# Patient Record
Sex: Female | Born: 1980
Health system: Southern US, Community
[De-identification: ages and names within clinical notes are randomized; demographics above are authoritative.]

## PROBLEM LIST (undated history)

## (undated) DIAGNOSIS — F419 Anxiety disorder, unspecified: Secondary | ICD-10-CM

## (undated) DIAGNOSIS — F329 Major depressive disorder, single episode, unspecified: Secondary | ICD-10-CM

## (undated) DIAGNOSIS — F32A Depression, unspecified: Secondary | ICD-10-CM

## (undated) HISTORY — PX: CERVICAL CERCLAGE: SHX1329

## (undated) HISTORY — DX: Anxiety disorder, unspecified: F41.9

## (undated) HISTORY — DX: Depression, unspecified: F32.A

## (undated) HISTORY — DX: Major depressive disorder, single episode, unspecified: F32.9

---

## 2016-04-16 DIAGNOSIS — N921 Excessive and frequent menstruation with irregular cycle: Secondary | ICD-10-CM | POA: Diagnosis not present

## 2016-04-16 DIAGNOSIS — R5383 Other fatigue: Secondary | ICD-10-CM | POA: Diagnosis not present

## 2016-04-16 DIAGNOSIS — E669 Obesity, unspecified: Secondary | ICD-10-CM | POA: Diagnosis not present

## 2016-04-27 DIAGNOSIS — Z111 Encounter for screening for respiratory tuberculosis: Secondary | ICD-10-CM | POA: Diagnosis not present

## 2016-04-27 DIAGNOSIS — Z01411 Encounter for gynecological examination (general) (routine) with abnormal findings: Secondary | ICD-10-CM | POA: Diagnosis not present

## 2016-04-27 DIAGNOSIS — Z6836 Body mass index (BMI) 36.0-36.9, adult: Secondary | ICD-10-CM | POA: Diagnosis not present

## 2016-04-27 DIAGNOSIS — N9089 Other specified noninflammatory disorders of vulva and perineum: Secondary | ICD-10-CM | POA: Diagnosis not present

## 2016-04-27 LAB — HM PAP SMEAR: HM PAP: NEGATIVE

## 2016-05-07 DIAGNOSIS — J069 Acute upper respiratory infection, unspecified: Secondary | ICD-10-CM | POA: Diagnosis not present

## 2016-05-07 DIAGNOSIS — B9789 Other viral agents as the cause of diseases classified elsewhere: Secondary | ICD-10-CM | POA: Diagnosis not present

## 2016-05-07 DIAGNOSIS — J111 Influenza due to unidentified influenza virus with other respiratory manifestations: Secondary | ICD-10-CM | POA: Diagnosis not present

## 2016-05-16 DIAGNOSIS — J0110 Acute frontal sinusitis, unspecified: Secondary | ICD-10-CM | POA: Diagnosis not present

## 2016-07-05 DIAGNOSIS — J0110 Acute frontal sinusitis, unspecified: Secondary | ICD-10-CM | POA: Diagnosis not present

## 2016-07-13 DIAGNOSIS — J40 Bronchitis, not specified as acute or chronic: Secondary | ICD-10-CM | POA: Diagnosis not present

## 2016-07-16 DIAGNOSIS — J40 Bronchitis, not specified as acute or chronic: Secondary | ICD-10-CM | POA: Diagnosis not present

## 2016-07-23 DIAGNOSIS — J40 Bronchitis, not specified as acute or chronic: Secondary | ICD-10-CM | POA: Diagnosis not present

## 2016-10-05 DIAGNOSIS — J208 Acute bronchitis due to other specified organisms: Secondary | ICD-10-CM | POA: Diagnosis not present

## 2016-10-05 DIAGNOSIS — B9689 Other specified bacterial agents as the cause of diseases classified elsewhere: Secondary | ICD-10-CM | POA: Diagnosis not present

## 2016-10-16 DIAGNOSIS — N644 Mastodynia: Secondary | ICD-10-CM | POA: Diagnosis not present

## 2016-10-24 DIAGNOSIS — J40 Bronchitis, not specified as acute or chronic: Secondary | ICD-10-CM | POA: Diagnosis not present

## 2017-05-09 DIAGNOSIS — B373 Candidiasis of vulva and vagina: Secondary | ICD-10-CM | POA: Diagnosis not present

## 2017-05-09 DIAGNOSIS — N898 Other specified noninflammatory disorders of vagina: Secondary | ICD-10-CM | POA: Diagnosis not present

## 2017-05-09 DIAGNOSIS — Z01411 Encounter for gynecological examination (general) (routine) with abnormal findings: Secondary | ICD-10-CM | POA: Diagnosis not present

## 2017-05-09 DIAGNOSIS — N760 Acute vaginitis: Secondary | ICD-10-CM | POA: Diagnosis not present

## 2017-06-21 DIAGNOSIS — E669 Obesity, unspecified: Secondary | ICD-10-CM | POA: Diagnosis not present

## 2017-06-21 DIAGNOSIS — Z760 Encounter for issue of repeat prescription: Secondary | ICD-10-CM | POA: Diagnosis not present

## 2017-08-23 DIAGNOSIS — B351 Tinea unguium: Secondary | ICD-10-CM | POA: Diagnosis not present

## 2017-09-18 DIAGNOSIS — Z3169 Encounter for other general counseling and advice on procreation: Secondary | ICD-10-CM | POA: Diagnosis not present

## 2017-10-28 DIAGNOSIS — F411 Generalized anxiety disorder: Secondary | ICD-10-CM | POA: Diagnosis not present

## 2017-10-28 DIAGNOSIS — F3340 Major depressive disorder, recurrent, in remission, unspecified: Secondary | ICD-10-CM | POA: Diagnosis not present

## 2017-11-01 DIAGNOSIS — F411 Generalized anxiety disorder: Secondary | ICD-10-CM | POA: Diagnosis not present

## 2017-11-01 DIAGNOSIS — F410 Panic disorder [episodic paroxysmal anxiety] without agoraphobia: Secondary | ICD-10-CM | POA: Diagnosis not present

## 2017-11-01 DIAGNOSIS — F339 Major depressive disorder, recurrent, unspecified: Secondary | ICD-10-CM | POA: Diagnosis not present

## 2018-01-16 DIAGNOSIS — Z23 Encounter for immunization: Secondary | ICD-10-CM | POA: Diagnosis not present

## 2018-01-31 DIAGNOSIS — F410 Panic disorder [episodic paroxysmal anxiety] without agoraphobia: Secondary | ICD-10-CM | POA: Diagnosis not present

## 2018-01-31 DIAGNOSIS — F339 Major depressive disorder, recurrent, unspecified: Secondary | ICD-10-CM | POA: Diagnosis not present

## 2018-01-31 DIAGNOSIS — F411 Generalized anxiety disorder: Secondary | ICD-10-CM | POA: Diagnosis not present

## 2018-02-17 ENCOUNTER — Ambulatory Visit: Payer: Self-pay | Admitting: *Deleted

## 2018-02-17 NOTE — Telephone Encounter (Signed)
FYI. Patient scheduled for an appointment 02/18/18 to establish care

## 2018-02-17 NOTE — Telephone Encounter (Signed)
Pt called with complaints of pain in the left side of her chest that started over a month ago; it has not been intermittent; for the past couple of days it left arm and shoulder are sore and hurting; she also has a shooting pain on the left side of her left shoulder blade; recommendations made per nurse triage protocol; pt offered and accepted new pt appointment with Malva Coganody Martin, [facility], 02/18/18 at 1100;  explained to pt that she will have fasting labs after her visit; and to please contact her prior MD's office so that her records could be faxed to [phone]; she verbalized understanding; will route to office for notification of this upcoming appointment.  Reason for Disposition . [1] Chest pain(s) lasting a few seconds AND [2] persists > 3 days  Answer Assessment - Initial Assessment Questions 1. LOCATION: "Where does it hurt?"       Left side of chest 2. RADIATION: "Does the pain go anywhere else?" (e.g., into neck, jaw, arms, back)    Left shoulder and  arm 3. ONSET: "When did the chest pain begin?" (Minutes, hours or days)      Several weeks ago 4. PATTERN "Does the pain come and go, or has it been constant since it started?"  "Does it get worse with exertion?"      Started intermittent, but now constant  5. DURATION: "How long does it last" (e.g., seconds, minutes, hours)     hours 6. SEVERITY: "How bad is the pain?"  (e.g., Scale 1-10; mild, moderate, or severe)    - MILD (1-3): doesn't interfere with normal activities     - MODERATE (4-7): interferes with normal activities or awakens from sleep    - SEVERE (8-10): excruciating pain, unable to do any normal activities       moderate 7. CARDIAC RISK FACTORS: "Do you have any history of heart problems or risk factors for heart disease?" (e.g., prior heart attack, angina; high blood pressure, diabetes, being overweight, high cholesterol, smoking, or strong family history of heart disease)    Former smoker, overweight 8.  PULMONARY RISK FACTORS: "Do you have any history of lung disease?"  (e.g., blood clots in lung, asthma, emphysema, birth control pills)     no 9. CAUSE: "What do you think is causing the chest pain?"     Not sure 10. OTHER SYMPTOMS: "Do you have any other symptoms?" (e.g., dizziness, nausea, vomiting, sweating, fever, difficulty breathing, cough)       no 11. PREGNANCY: "Is there any chance you are pregnant?" "When was your last menstrual period?"       nexplanon in left arm; LMP 02/16/18  Protocols used: CHEST PAIN-A-AH

## 2018-02-18 ENCOUNTER — Other Ambulatory Visit: Payer: Self-pay

## 2018-02-18 ENCOUNTER — Encounter: Payer: Self-pay | Admitting: Physician Assistant

## 2018-02-18 ENCOUNTER — Ambulatory Visit: Payer: BLUE CROSS/BLUE SHIELD | Admitting: Physician Assistant

## 2018-02-18 VITALS — BP 120/84 | HR 87 | Temp 98.8°F | Resp 14 | Ht 64.0 in | Wt 206.0 lb

## 2018-02-18 DIAGNOSIS — R0789 Other chest pain: Secondary | ICD-10-CM

## 2018-02-18 LAB — CBC WITH DIFFERENTIAL/PLATELET
BASOS ABS: 0 10*3/uL (ref 0.0–0.1)
Basophils Relative: 0.3 % (ref 0.0–3.0)
Eosinophils Absolute: 0.2 10*3/uL (ref 0.0–0.7)
Eosinophils Relative: 2.1 % (ref 0.0–5.0)
HEMATOCRIT: 40.3 % (ref 36.0–46.0)
Hemoglobin: 13.7 g/dL (ref 12.0–15.0)
LYMPHS PCT: 36.4 % (ref 12.0–46.0)
Lymphs Abs: 2.8 10*3/uL (ref 0.7–4.0)
MCHC: 34 g/dL (ref 30.0–36.0)
MCV: 91.1 fl (ref 78.0–100.0)
MONOS PCT: 5.7 % (ref 3.0–12.0)
Monocytes Absolute: 0.4 10*3/uL (ref 0.1–1.0)
Neutro Abs: 4.2 10*3/uL (ref 1.4–7.7)
Neutrophils Relative %: 55.5 % (ref 43.0–77.0)
Platelets: 333 10*3/uL (ref 150.0–400.0)
RBC: 4.42 Mil/uL (ref 3.87–5.11)
RDW: 12.8 % (ref 11.5–15.5)
WBC: 7.6 10*3/uL (ref 4.0–10.5)

## 2018-02-18 LAB — COMPREHENSIVE METABOLIC PANEL
ALT: 16 U/L (ref 0–35)
AST: 16 U/L (ref 0–37)
Albumin: 4.4 g/dL (ref 3.5–5.2)
Alkaline Phosphatase: 60 U/L (ref 39–117)
BILIRUBIN TOTAL: 0.4 mg/dL (ref 0.2–1.2)
BUN: 10 mg/dL (ref 6–23)
CO2: 26 meq/L (ref 19–32)
CREATININE: 0.6 mg/dL (ref 0.40–1.20)
Calcium: 9.3 mg/dL (ref 8.4–10.5)
Chloride: 106 mEq/L (ref 96–112)
GFR: 118.93 mL/min (ref >60.00)
Glucose, Bld: 90 mg/dL (ref 70–99)
Potassium: 4 mEq/L (ref 3.5–5.1)
SODIUM: 139 meq/L (ref 135–145)
Total Protein: 7.1 g/dL (ref 6.0–8.3)

## 2018-02-18 LAB — LIPASE: Lipase: 21 U/L (ref 11.0–59.0)

## 2018-02-18 MED ORDER — CYCLOBENZAPRINE HCL 5 MG PO TABS: 5.0000 mg | ORAL_TABLET | Freq: Every day | ORAL | 1 refills | Status: DC

## 2018-02-18 NOTE — Patient Instructions (Signed)
Please go to the lab today for blood work.  I will call you with your results. We will alter treatment regimen(s) if indicated by your results.   [facility] afternoon or tomorrow morning for x-ray. I will call with results and we will discuss next steps based on imaging and lab findings.  For now start a daily Aleve.  I am giving you a low dose Flexeril to take in the evening to help with muscle tension in neck and chest.

## 2018-02-18 NOTE — Progress Notes (Signed)
New Patient Office Visit  Subjective:  Patient ID: Becky Fernandez, female    DOB: 09-27-1980  Age: 37 y.o. MRN: [id number]  CC:  Chief Complaint  Patient presents with  . Establish Care    HPI Becky Fernandez presents to clinic today as a new patient with acute concerns. Patent with prior history of anxiety and depression, followed by the [facility] in [place]. Is currently on a regimen of Wellbutrin XL 150 mg daily. Is also taking Ativan '1mg'$  PRN for more acute anxiety. Has follow-up scheduled monthly at present. Notes she is tolerating medications well and that they are helping with her mood.   Patient endorses 6 months of intermittent upper left chest pain with radiation into her shoulder blade. Notes that episodes can last for several hours at a time and are not associated with any certain activity. Notes chronic tension in her neck related to stress and anxiety but denies any other known issued with neck or spine. Patient notes that when pain is present it can rate up to an 8/10. Denies any pleuritic pain or pain with ROM of chest or shoulder. Denies any palpitations, lightheadedness or dizziness. Denies any exertional component of symptoms. Denies heart burn, indigestion or epigastric pain. Denies change to bowel/bladderf habits. Initially thought symptoms were related to stress/anxiety but notes mood has improved. Denies known history of early CAD. Endorses good sleep and a well-balanced diet. Keeps active at the gym and chasing after her children.   Past Medical History:  Diagnosis Date  . Anxiety   . Depression     History reviewed. No pertinent surgical history.  Family History  Problem Relation Age of Onset  . Thyroid disease Mother   . Hyperlipidemia Father   . Intellectual disability Daughter   . Learning disabilities Daughter   . Premature birth Daughter     Social History   Socioeconomic History  . Marital status: Married    Spouse name: Not on file  . Number of  children: Not on file  . Years of education: Not on file  . Highest education level: Not on file  Occupational History  . Not on file  Social Needs  . Financial resource strain: Not on file  . Food insecurity:    Worry: Not on file    Inability: Not on file  . Transportation needs:    Medical: Not on file    Non-medical: Not on file  Tobacco Use  . Smoking status: Never Smoker  . Smokeless tobacco: Never Used  Substance and Sexual Activity  . Alcohol use: Yes    Comment: occasional  . Drug use: Yes    Types: Marijuana  . Sexual activity: Not on file  Lifestyle  . Physical activity:    Days per week: Not on file    Minutes per session: Not on file  . Stress: Not on file  Relationships  . Social connections:    Talks on phone: Not on file    Gets together: Not on file    Attends religious service: Not on file    Active member of club or organization: Not on file    Attends meetings of clubs or organizations: Not on file    Relationship status: Not on file  . Intimate partner violence:    Fear of current or ex partner: Not on file    Emotionally abused: Not on file    Physically abused: Not on file    Forced sexual activity: Not on  file  Other Topics Concern  . Not on file  Social History Narrative  . Not on file   ROS Review of Systems  Constitutional: Negative for activity change, appetite change and fatigue.  Respiratory: Positive for chest tightness. Negative for apnea, cough, choking, shortness of breath, wheezing and stridor.   Cardiovascular: Positive for chest pain. Negative for palpitations and leg swelling.  Gastrointestinal: Negative for abdominal distention, abdominal pain, blood in stool, constipation, diarrhea, nausea and vomiting.  Genitourinary: Negative.   Neurological: Negative for dizziness, syncope, light-headedness and headaches.  Psychiatric/Behavioral: The patient is nervous/anxious (chronic and improved per patient.).     Objective:    Today's Vitals: BP 120/84   Pulse 87   Temp 98.8 F (37.1 C) (Oral)   Resp 14   Ht '5\' 4"'$  (1.626 m)   Wt 206 lb (93.4 kg)   SpO2 99%   BMI 35.36 kg/m   Physical Exam  Constitutional: She is oriented to person, place, and time. She appears well-developed and well-nourished. No distress.  HENT:  Head: Normocephalic and atraumatic.  Right Ear: External ear normal.  Left Ear: External ear normal.  Nose: Nose normal.  Mouth/Throat: Oropharynx is clear and moist.  TM within normal limits bilaterally.   Eyes: Pupils are equal, round, and reactive to light. Conjunctivae and EOM are normal.  Neck: Neck supple. Muscular tenderness present. No spinous process tenderness present. Normal range of motion present. No thyromegaly present.  Cardiovascular: Normal rate, regular rhythm, normal heart sounds and intact distal pulses.  Pulmonary/Chest: Effort normal and breath sounds normal. No respiratory distress. She has no wheezes. She has no rales. She exhibits no tenderness.  Abdominal: Soft. Bowel sounds are normal. She exhibits no distension and no mass. There is no tenderness. No hernia.  Musculoskeletal:       Cervical back: She exhibits normal range of motion, no tenderness, no bony tenderness and no swelling.       Thoracic back: Normal.  Lymphadenopathy:    She has no cervical adenopathy.  Neurological: She is alert and oriented to person, place, and time.  Skin: She is not diaphoretic.  Psychiatric: She has a normal mood and affect. Her behavior is normal.  Vitals reviewed.  Assessment & Plan:   Problem List Items Addressed This Visit      Other   Atypical chest pain - Primary    First time discussed with this provider. No prior assessment. Does not seem to be cardiac or gastric in nature. Vitals stable today. EKG obtained revealing NSR which is reassuring. Giving her smoking history will check CXR and labs today. If negative would recommend further assessment of her neck as a  potential cause as I am concerned for pinching of cervical nerves. There is muscular tension in the cervical neck and upper chest. Will start small-dose of a muscle relaxer to help with this. Supportive measures reviewed. Strict ER precautions reviewed. Close follow-up to be scheduled.       Relevant Orders   EKG 12-Lead (Completed)   DG Chest 2 View   CBC w/Diff (Completed)   Lipase (Completed)   Comp Met (CMET) (Completed)      Outpatient Encounter Medications as of 02/18/2018  Medication Sig  . buPROPion (WELLBUTRIN XL) 150 MG 24 hr tablet TK 1 T PO QAM  . LORazepam (ATIVAN) 1 MG tablet TK 1 T PO QAM PRN FOR SEVERE ANXIETY  . terbinafine (LAMISIL) 250 MG tablet Take 250 mg by mouth daily.  Marland Kitchen  cyclobenzaprine (FLEXERIL) 5 MG tablet Take 1 tablet (5 mg total) by mouth at bedtime.   No facility-administered encounter medications on file as of 02/18/2018.     Follow-up: No follow-ups on file.   Leeanne Rio, PA-C

## 2018-02-19 DIAGNOSIS — R0789 Other chest pain: Secondary | ICD-10-CM | POA: Insufficient documentation

## 2018-02-19 NOTE — Assessment & Plan Note (Signed)
First time discussed with this provider. No prior assessment. Does not seem to be cardiac or gastric in nature. Vitals stable today. EKG obtained revealing NSR which is reassuring. Giving her smoking history will check CXR and labs today. If negative would recommend further assessment of her neck as a potential cause as I am concerned for pinching of cervical nerves. There is muscular tension in the cervical neck and upper chest. Will start small-dose of a muscle relaxer to help with this. Supportive measures reviewed. Strict ER precautions reviewed. Close follow-up to be scheduled.

## 2018-02-24 ENCOUNTER — Other Ambulatory Visit: Payer: Self-pay | Admitting: Physician Assistant

## 2018-02-24 ENCOUNTER — Ambulatory Visit (INDEPENDENT_AMBULATORY_CARE_PROVIDER_SITE_OTHER): Payer: BLUE CROSS/BLUE SHIELD

## 2018-02-24 DIAGNOSIS — F410 Panic disorder [episodic paroxysmal anxiety] without agoraphobia: Secondary | ICD-10-CM | POA: Diagnosis not present

## 2018-02-24 DIAGNOSIS — R0789 Other chest pain: Secondary | ICD-10-CM

## 2018-02-24 DIAGNOSIS — J42 Unspecified chronic bronchitis: Secondary | ICD-10-CM

## 2018-02-24 DIAGNOSIS — R079 Chest pain, unspecified: Secondary | ICD-10-CM | POA: Diagnosis not present

## 2018-02-24 DIAGNOSIS — Z3009 Encounter for other general counseling and advice on contraception: Secondary | ICD-10-CM

## 2018-02-24 DIAGNOSIS — F339 Major depressive disorder, recurrent, unspecified: Secondary | ICD-10-CM | POA: Diagnosis not present

## 2018-02-24 DIAGNOSIS — F411 Generalized anxiety disorder: Secondary | ICD-10-CM | POA: Diagnosis not present

## 2018-02-24 MED ORDER — NORETHIN ACE-ETH ESTRAD-FE 1-20 MG-MCG PO TABS: 1.0000 | ORAL_TABLET | Freq: Every day | ORAL | 11 refills | Status: AC

## 2018-03-10 ENCOUNTER — Ambulatory Visit: Payer: BLUE CROSS/BLUE SHIELD | Admitting: Family Medicine

## 2018-03-11 DIAGNOSIS — J069 Acute upper respiratory infection, unspecified: Secondary | ICD-10-CM | POA: Diagnosis not present

## 2018-04-18 ENCOUNTER — Encounter: Payer: Self-pay | Admitting: Pulmonary Disease

## 2018-04-18 ENCOUNTER — Ambulatory Visit: Payer: BLUE CROSS/BLUE SHIELD | Admitting: Pulmonary Disease

## 2018-04-18 VITALS — BP 120/80 | HR 84 | Ht 63.0 in | Wt 212.2 lb

## 2018-04-18 DIAGNOSIS — R05 Cough: Secondary | ICD-10-CM

## 2018-04-18 DIAGNOSIS — F1210 Cannabis abuse, uncomplicated: Secondary | ICD-10-CM

## 2018-04-18 DIAGNOSIS — R059 Cough, unspecified: Secondary | ICD-10-CM

## 2018-04-18 NOTE — Patient Instructions (Signed)
Thank you for visiting Dr. Tonia Brooms at [facility]. Today we recommend the following: Orders Placed This Encounter  Procedures  . Pulmonary function test    Return in about 2 weeks (around 05/02/2018).Please schedule F/U with NP for PFT.  results.

## 2018-04-18 NOTE — Progress Notes (Signed)
Synopsis: Referred in Jan 2020 for SOB, Chest tightness by Waldon Merl, PA-C  Subjective:   PATIENT ID: Becky Fernandez GENDER: female DOB: [birth year], MRN: [id number]  Chief Complaint  Patient presents with  . Consult    Consult for Bronchitis. Hx of bronchitis. She states she started having chest pains that radiated into back. States she has chest tightness intermittently and she still has the upper back pain. She has not smoked in 2 weeks.      PMH anxiety and depress, current MJ smoker, quit smoking a few weeks ago, She has pain in her upper left posterior back. On occasion is having a dull ache in the back. She cant reproduce it if she moves. She was seen by her PCP and had a CXR ordered. She has smoked MJ on and off for the past 15 years, usually 3-5 times per week. Currently seeing a psychiatrist and taking wellbutrin to manage her anxiety and depression. She has been able to quit. Prior to this event she has had bronchitis. She works for [employer] for the past 15 years. 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    Past Medical History:  Diagnosis Date  . Anxiety   . Depression      Family History  Problem Relation Age of Onset  . Thyroid disease Mother   . Hyperlipidemia Father   . Intellectual disability Daughter   . Learning disabilities Daughter   . Premature birth Daughter      Past Surgical History:  Procedure Laterality Date  . CERVICAL CERCLAGE      Social History   Socioeconomic History  . Marital status: Married    Spouse name: Not on file  . Number of children: Not on file  . Years of education: Not on file  . Highest education level: Not on file  Occupational History  . Not on file  Social Needs  . Financial resource strain: Not on file  . Food insecurity:    Worry: Not on file    Inability: Not on file  . Transportation needs:    Medical: Not on file    Non-medical: Not on file  Tobacco Use  . Smoking status: Former Games developer   . Smokeless tobacco: Never Used  Substance and Sexual Activity  . Alcohol use: Yes    Comment: occasional  . Drug use: Yes    Frequency: 5.0 times per week    Types: Marijuana    Comment: 15 years  . Sexual activity: Not on file  Lifestyle  . Physical activity:    Days per week: Not on file    Minutes per session: Not on file  . Stress: Not on file  Relationships  . Social connections:    Talks on phone: Not on file    Gets together: Not on file    Attends religious service: Not on file    Active member of club or organization: Not on file    Attends meetings of clubs or organizations: Not on file    Relationship status: Not on file  . Intimate partner violence:    Fear of current or ex partner: Not on file    Emotionally abused: Not on file    Physically abused: Not on file    Forced sexual activity: Not on file  Other Topics Concern  . Not on file  Social History Narrative  . Not on file     No Known Allergies   Outpatient Medications Prior  to Visit  Medication Sig Dispense Refill  . buPROPion (WELLBUTRIN XL) 300 MG 24 hr tablet Take 300 mg by mouth daily.    Marland Kitchen. ibuprofen (ADVIL,MOTRIN) 200 MG tablet Take 200 mg by mouth every 6 (six) hours as needed.    Marland Kitchen. LORazepam (ATIVAN) 1 MG tablet TK 1 T PO QAM PRN FOR SEVERE ANXIETY  1  . norethindrone-ethinyl estradiol (LOESTRIN FE 1/20) 1-20 MG-MCG tablet Take 1 tablet by mouth daily. 1 Package 11  . buPROPion (WELLBUTRIN XL) 150 MG 24 hr tablet TK 1 T PO QAM  1  . cyclobenzaprine (FLEXERIL) 5 MG tablet Take 1 tablet (5 mg total) by mouth at bedtime. (Patient not taking: Reported on 04/18/2018) 30 tablet 1  . terbinafine (LAMISIL) 250 MG tablet Take 250 mg by mouth daily.     No facility-administered medications prior to visit.     Review of Systems  Constitutional: Negative for chills, fever, malaise/fatigue and weight loss.  HENT: Negative for hearing loss, sore throat and tinnitus.   Eyes: Negative for blurred vision  and double vision.  Respiratory: Negative for cough, hemoptysis, sputum production, shortness of breath, wheezing and stridor.   Cardiovascular: Positive for chest pain. Negative for palpitations, orthopnea, leg swelling and PND.  Gastrointestinal: Negative for abdominal pain, constipation, diarrhea, heartburn, nausea and vomiting.  Genitourinary: Negative for dysuria, hematuria and urgency.  Musculoskeletal: Negative for joint pain and myalgias.  Skin: Negative for itching and rash.  Neurological: Negative for dizziness, tingling, weakness and headaches.  Endo/Heme/Allergies: Negative for environmental allergies. Does not bruise/bleed easily.  Psychiatric/Behavioral: Negative for depression. The patient is not nervous/anxious and does not have insomnia.   All other systems reviewed and are negative.    Objective:  Physical Exam Vitals signs reviewed.  Constitutional:      General: She is not in acute distress.    Appearance: She is well-developed.  HENT:     Head: Normocephalic and atraumatic.  Eyes:     General: No scleral icterus.    Conjunctiva/sclera: Conjunctivae normal.     Pupils: Pupils are equal, round, and reactive to light.  Neck:     Musculoskeletal: Neck supple.     Vascular: No JVD.     Trachea: No tracheal deviation.  Cardiovascular:     Rate and Rhythm: Normal rate and regular rhythm.     Heart sounds: Normal heart sounds. No murmur.  Pulmonary:     Effort: Pulmonary effort is normal. No tachypnea, accessory muscle usage or respiratory distress.     Breath sounds: Normal breath sounds. No stridor. No wheezing, rhonchi or rales.  Abdominal:     General: Bowel sounds are normal. There is no distension.     Palpations: Abdomen is soft.     Tenderness: There is no abdominal tenderness.  Musculoskeletal:        General: No tenderness.  Lymphadenopathy:     Cervical: No cervical adenopathy.  Skin:    General: Skin is warm and dry.     Capillary Refill:  Capillary refill takes less than 2 seconds.     Findings: No rash.  Neurological:     Mental Status: She is alert and oriented to person, place, and time.  Psychiatric:        Behavior: Behavior normal.      Vitals:   04/18/18 0931  BP: 120/80  Pulse: 84  SpO2: 100%  Weight: 212 lb 3.2 oz (96.3 kg)  Height: 5\' 3"  (1.6 m)   100%  on RA BMI Readings from Last 3 Encounters:  04/18/18 37.59 kg/m  02/18/18 35.36 kg/m   Wt Readings from Last 3 Encounters:  04/18/18 212 lb 3.2 oz (96.3 kg)  02/18/18 206 lb (93.4 kg)     CBC    Component Value Date/Time   WBC 7.6 02/18/2018 1159   RBC 4.42 02/18/2018 1159   HGB 13.7 02/18/2018 1159   HCT 40.3 02/18/2018 1159   PLT 333.0 02/18/2018 1159   MCV 91.1 02/18/2018 1159   MCHC 34.0 02/18/2018 1159   RDW 12.8 02/18/2018 1159   LYMPHSABS 2.8 02/18/2018 1159   MONOABS 0.4 02/18/2018 1159   EOSABS 0.2 02/18/2018 1159   BASOSABS 0.0 02/18/2018 1159   Chest Imaging: 04/26/2017 chest x-ray: Evidence of bronchitis-like changes The patient's images have been independently reviewed by me.    Pulmonary Functions Testing Results: None   FeNO: None   Pathology: None   Echocardiogram: None   Heart Catheterization: None     Assessment & Plan:   Marijuana abuse - Plan: Pulmonary function test  Cough - Plan: Pulmonary function test  Discussion:  This is a 38 year old female no significant medical problems.  Does use marijuana for the past 15 years 3-5 times a week.  She recently had an episode of upper posterior back vague chest discomfort.  This is better and has slowly improved over the past several weeks.  She was seen by her primary care provider which ordered a chest x-ray that revealed some evidence of bronchitis.  She was referred here for evaluation.  She has no obvious or other clinical signs of interstitial lung disease.  The radiologist did comment on some interstitial thickening seen on chest x-ray.  I reviewed the  images today with the patient in the office.  We will start evaluation by obtaining pulmonary function tests.  I believe that for pulmonary function tests are normal I do not see the need for axial CT imaging of the chest.  How if there is any abnormalities in the pulmonary function test I would suggest that we would proceed with imaging of the chest. Today we discussed that today in the office regarding benefits and risk and alternatives of proceeding with imaging versus holding off on this at this time.  She would like to hold off and does agree.  Patient to return to clinic in 1 to 2 weeks to have pulmonary function test completed and review this with 1 of our nurse practitioners. She was also counseled on continued cessation of marijuana use.  She has not done this in the past 2 weeks and she plans to completely quit.  Greater than 50% of this patient's 40-minute office was spent face-to-face discussing but recommendations and treatment plan.   Current Outpatient Medications:  .  buPROPion (WELLBUTRIN XL) 300 MG 24 hr tablet, Take 300 mg by mouth daily., Disp: , Rfl:  .  ibuprofen (ADVIL,MOTRIN) 200 MG tablet, Take 200 mg by mouth every 6 (six) hours as needed., Disp: , Rfl:  .  LORazepam (ATIVAN) 1 MG tablet, TK 1 T PO QAM PRN FOR SEVERE ANXIETY, Disp: , Rfl: 1 .  norethindrone-ethinyl estradiol (LOESTRIN FE 1/20) 1-20 MG-MCG tablet, Take 1 tablet by mouth daily., Disp: 1 Package, Rfl: 11   Josephine IgoBradley L Ilea Hilton, DO [facility] 04/18/2018 11:27 AM

## 2018-05-09 ENCOUNTER — Ambulatory Visit: Payer: BLUE CROSS/BLUE SHIELD | Admitting: Primary Care

## 2018-05-13 DIAGNOSIS — F410 Panic disorder [episodic paroxysmal anxiety] without agoraphobia: Secondary | ICD-10-CM | POA: Diagnosis not present

## 2018-05-13 DIAGNOSIS — F411 Generalized anxiety disorder: Secondary | ICD-10-CM | POA: Diagnosis not present

## 2018-05-13 DIAGNOSIS — F339 Major depressive disorder, recurrent, unspecified: Secondary | ICD-10-CM | POA: Diagnosis not present

## 2018-05-26 NOTE — Progress Notes (Deleted)
 @  Patient ID: Becky Fernandez, female    DOB: 1980-05-30, 38 y.o.   MRN: [id number]  No chief complaint on file.   Referring provider: Noel Journey  HPI: 38 year old female, former smoker (MJ smoker x 15 years.) PMH significant for bronchitis, anxiety, depression. Patient of Dr. Tonia Brooms, seen for initial consult on 04/18/18 for chest tightness and back pain. Planning for PFTs, if normal no need for CT imaging of the chest. If there is any abnormalities in PFTs needs imaging. Return in 1-2 weeks.      No Known Allergies  Immunization History  Administered Date(s) Administered  . Influenza-Unspecified 12/31/2017    Past Medical History:  Diagnosis Date  . Anxiety   . Depression     Tobacco History: Social History   Tobacco Use  Smoking Status Former Smoker  Smokeless Tobacco Never Used   Counseling given: Not Answered   Outpatient Medications Prior to Visit  Medication Sig Dispense Refill  . buPROPion (WELLBUTRIN XL) 300 MG 24 hr tablet Take 300 mg by mouth daily.    Marland Kitchen ibuprofen (ADVIL,MOTRIN) 200 MG tablet Take 200 mg by mouth every 6 (six) hours as needed.    Marland Kitchen LORazepam (ATIVAN) 1 MG tablet TK 1 T PO QAM PRN FOR SEVERE ANXIETY  1  . norethindrone-ethinyl estradiol (LOESTRIN FE 1/20) 1-20 MG-MCG tablet Take 1 tablet by mouth daily. 1 Package 11   No facility-administered medications prior to visit.       Review of Systems  Review of Systems   Physical Exam  There were no vitals taken for this visit. Physical Exam   Lab Results:  CBC    Component Value Date/Time   WBC 7.6 02/18/2018 1159   RBC 4.42 02/18/2018 1159   HGB 13.7 02/18/2018 1159   HCT 40.3 02/18/2018 1159   PLT 333.0 02/18/2018 1159   MCV 91.1 02/18/2018 1159   MCHC 34.0 02/18/2018 1159   RDW 12.8 02/18/2018 1159   LYMPHSABS 2.8 02/18/2018 1159   MONOABS 0.4 02/18/2018 1159   EOSABS 0.2 02/18/2018 1159   BASOSABS 0.0 02/18/2018 1159    BMET    Component Value  Date/Time   NA 139 02/18/2018 1159   K 4.0 02/18/2018 1159   CL 106 02/18/2018 1159   CO2 26 02/18/2018 1159   GLUCOSE 90 02/18/2018 1159   BUN 10 02/18/2018 1159   CREATININE 0.60 02/18/2018 1159   CALCIUM 9.3 02/18/2018 1159    BNP No results found for: BNP  ProBNP No results found for: PROBNP  Imaging: No results found.   Assessment & Plan:   No problem-specific Assessment & Plan notes found for this encounter.     Glenford Bayley, NP 05/26/2018

## 2018-05-27 ENCOUNTER — Ambulatory Visit: Payer: BLUE CROSS/BLUE SHIELD | Admitting: Primary Care

## 2018-06-24 DIAGNOSIS — F339 Major depressive disorder, recurrent, unspecified: Secondary | ICD-10-CM | POA: Diagnosis not present

## 2018-06-24 DIAGNOSIS — F411 Generalized anxiety disorder: Secondary | ICD-10-CM | POA: Diagnosis not present

## 2018-06-24 DIAGNOSIS — F410 Panic disorder [episodic paroxysmal anxiety] without agoraphobia: Secondary | ICD-10-CM | POA: Diagnosis not present

## 2018-06-26 ENCOUNTER — Encounter: Payer: Self-pay | Admitting: Emergency Medicine

## 2018-07-18 DIAGNOSIS — Z3046 Encounter for surveillance of implantable subdermal contraceptive: Secondary | ICD-10-CM | POA: Diagnosis not present

## 2018-07-18 DIAGNOSIS — Z309 Encounter for contraceptive management, unspecified: Secondary | ICD-10-CM | POA: Diagnosis not present

## 2018-07-18 DIAGNOSIS — Z3200 Encounter for pregnancy test, result unknown: Secondary | ICD-10-CM | POA: Diagnosis not present

## 2018-08-14 DIAGNOSIS — M549 Dorsalgia, unspecified: Secondary | ICD-10-CM | POA: Diagnosis not present

## 2018-09-11 DIAGNOSIS — M549 Dorsalgia, unspecified: Secondary | ICD-10-CM | POA: Diagnosis not present

## 2018-11-11 DIAGNOSIS — F410 Panic disorder [episodic paroxysmal anxiety] without agoraphobia: Secondary | ICD-10-CM | POA: Diagnosis not present

## 2018-11-11 DIAGNOSIS — F339 Major depressive disorder, recurrent, unspecified: Secondary | ICD-10-CM | POA: Diagnosis not present

## 2018-11-11 DIAGNOSIS — F411 Generalized anxiety disorder: Secondary | ICD-10-CM | POA: Diagnosis not present

## 2018-11-26 DIAGNOSIS — N760 Acute vaginitis: Secondary | ICD-10-CM | POA: Diagnosis not present

## 2019-01-09 DIAGNOSIS — N760 Acute vaginitis: Secondary | ICD-10-CM | POA: Diagnosis not present

## 2019-02-10 DIAGNOSIS — F339 Major depressive disorder, recurrent, unspecified: Secondary | ICD-10-CM | POA: Diagnosis not present

## 2019-02-10 DIAGNOSIS — F410 Panic disorder [episodic paroxysmal anxiety] without agoraphobia: Secondary | ICD-10-CM | POA: Diagnosis not present

## 2019-02-10 DIAGNOSIS — F411 Generalized anxiety disorder: Secondary | ICD-10-CM | POA: Diagnosis not present

## 2019-03-10 DIAGNOSIS — N760 Acute vaginitis: Secondary | ICD-10-CM | POA: Diagnosis not present

## 2019-03-10 DIAGNOSIS — N898 Other specified noninflammatory disorders of vagina: Secondary | ICD-10-CM | POA: Diagnosis not present

## 2019-06-16 DIAGNOSIS — J0100 Acute maxillary sinusitis, unspecified: Secondary | ICD-10-CM | POA: Diagnosis not present

## 2019-12-31 IMAGING — DX DG CHEST 2V
2 series · 2 of 2 positions shown · non-contrast
Comparison: None.

CLINICAL DATA: Six month history of left-sided chest pain radiating
into the left upper back. Nonsmoker. History of anxiety and
depression.

EXAM:
CHEST - 2 VIEW

[chest pa]
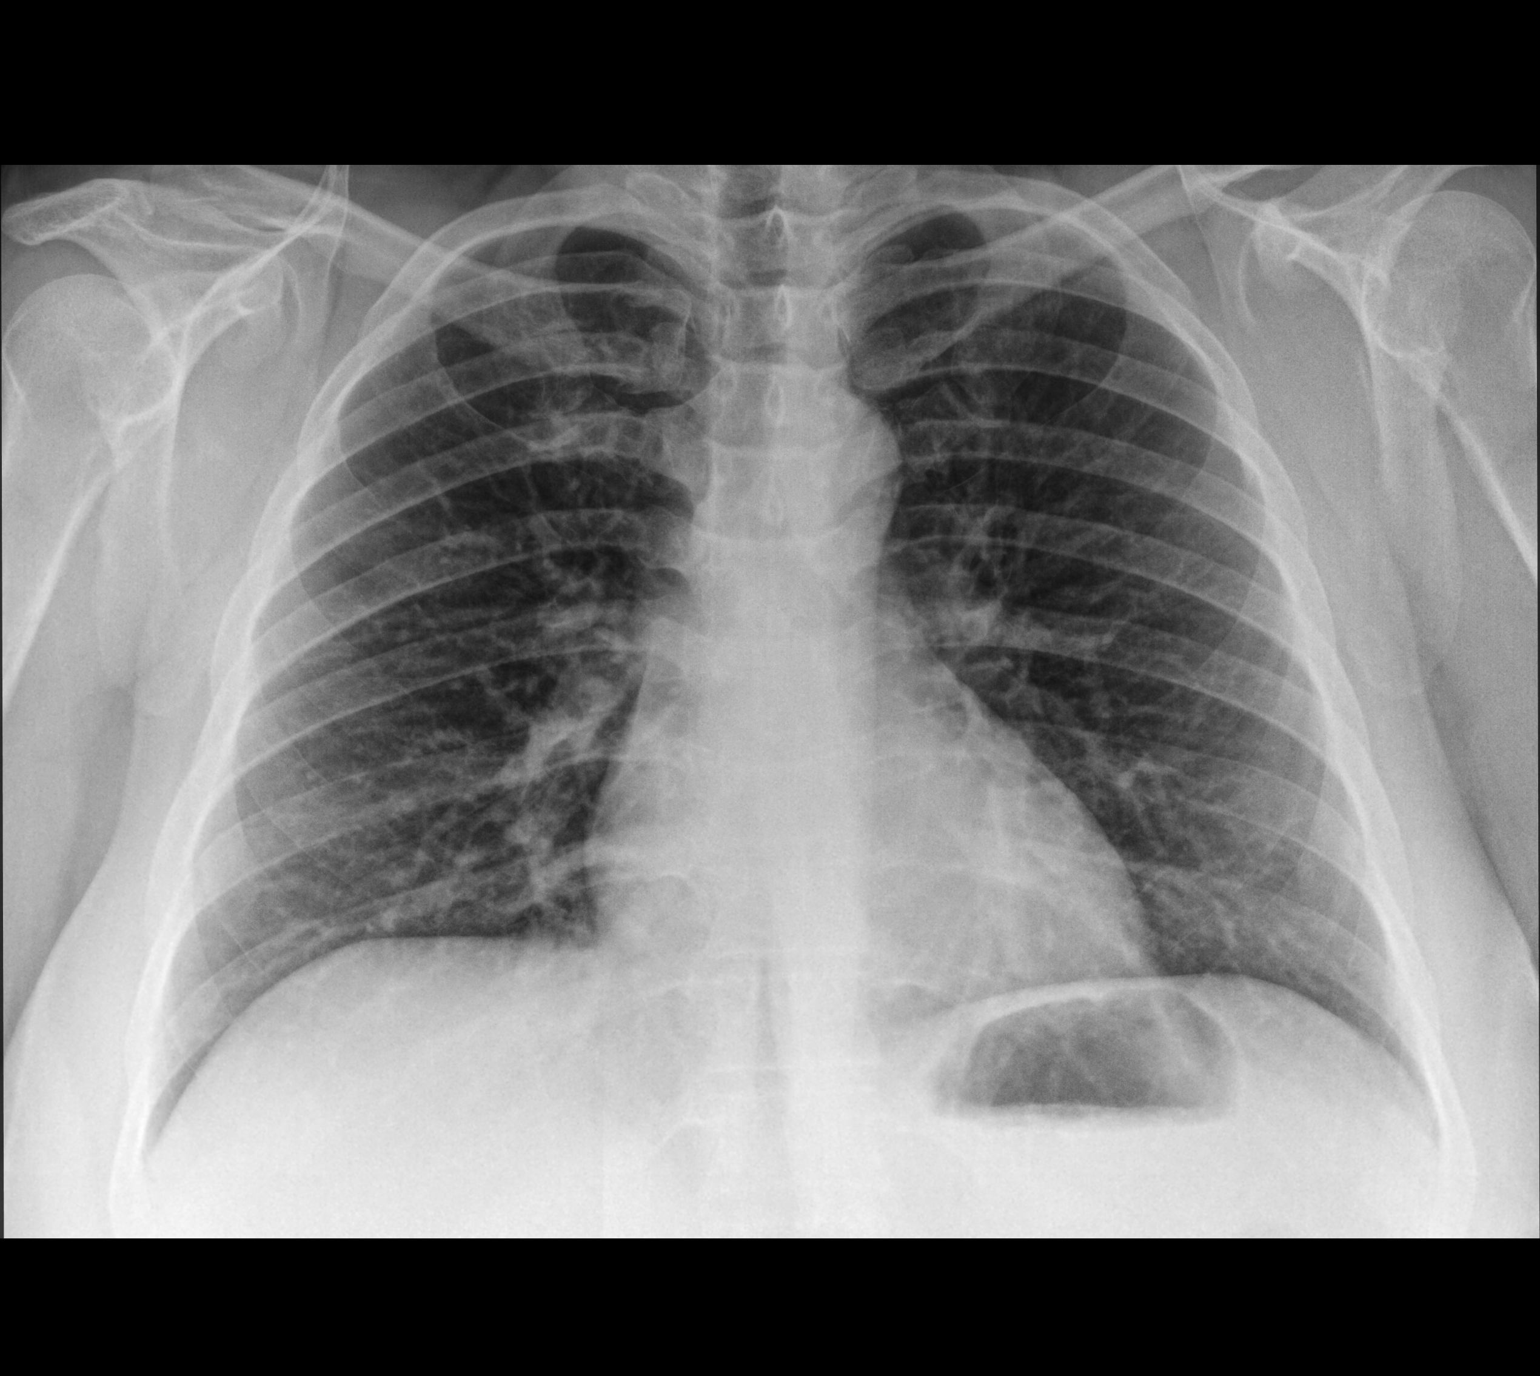

[chest lat]
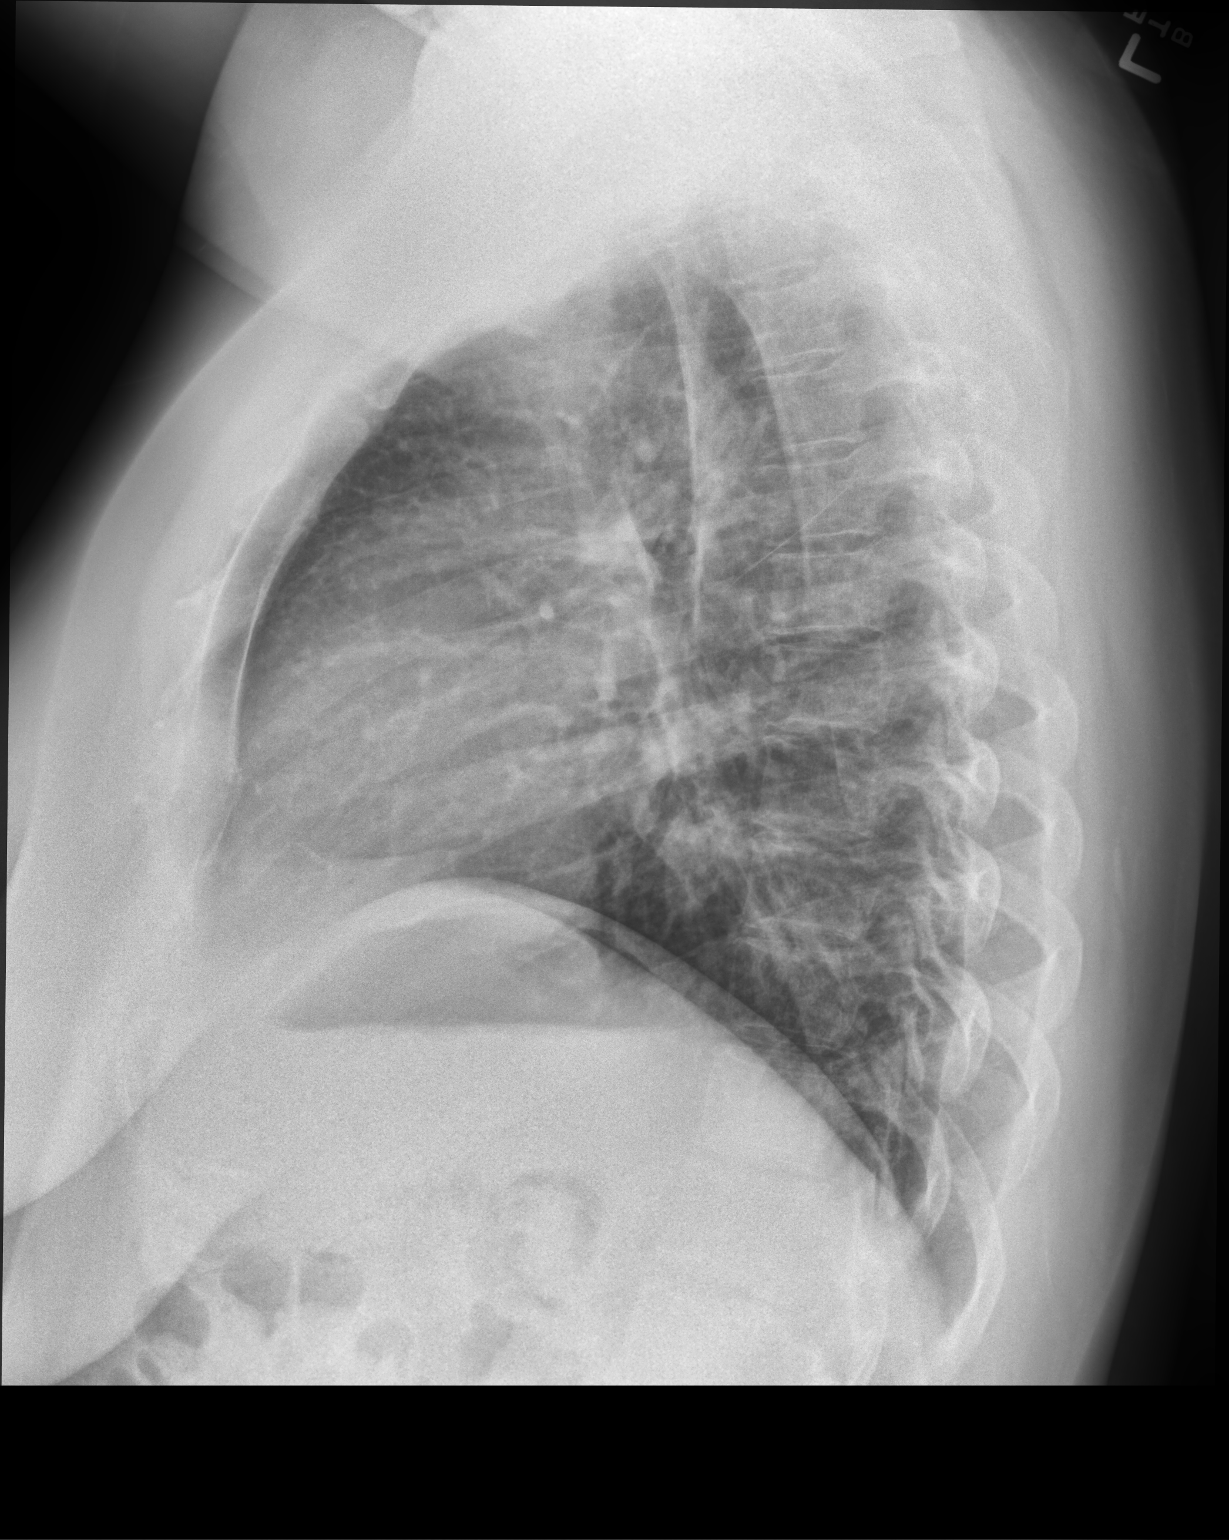

[2 of 2 positions shown; findings below may reference images not displayed]

FINDINGS: The lungs are adequately inflated. The interstitial markings are
coarse. There is no alveolar infiltrate or pleural effusion. The
heart and pulmonary vascularity are normal. The mediastinum is
normal in width. The trachea is midline. The bony thorax exhibits no
acute abnormality.
IMPRESSION: There is no acute cardiopulmonary abnormality. Mild interstitial
prominence may reflect bronchitic changes.

## 2020-02-01 DIAGNOSIS — F339 Major depressive disorder, recurrent, unspecified: Secondary | ICD-10-CM | POA: Diagnosis not present

## 2020-02-01 DIAGNOSIS — F411 Generalized anxiety disorder: Secondary | ICD-10-CM | POA: Diagnosis not present

## 2020-03-07 DIAGNOSIS — R110 Nausea: Secondary | ICD-10-CM | POA: Diagnosis not present

## 2020-03-07 DIAGNOSIS — R1110 Vomiting, unspecified: Secondary | ICD-10-CM | POA: Diagnosis not present

## 2020-05-19 DIAGNOSIS — L600 Ingrowing nail: Secondary | ICD-10-CM | POA: Diagnosis not present

## 2020-05-19 DIAGNOSIS — B351 Tinea unguium: Secondary | ICD-10-CM | POA: Diagnosis not present

## 2020-05-19 DIAGNOSIS — M25775 Osteophyte, left foot: Secondary | ICD-10-CM | POA: Diagnosis not present

## 2020-05-26 DIAGNOSIS — Z1629 Resistance to other single specified antibiotic: Secondary | ICD-10-CM | POA: Diagnosis not present

## 2020-05-26 DIAGNOSIS — B351 Tinea unguium: Secondary | ICD-10-CM | POA: Diagnosis not present

## 2020-05-26 DIAGNOSIS — Z1611 Resistance to penicillins: Secondary | ICD-10-CM | POA: Diagnosis not present

## 2020-05-26 DIAGNOSIS — L600 Ingrowing nail: Secondary | ICD-10-CM | POA: Diagnosis not present

## 2020-06-23 DIAGNOSIS — B351 Tinea unguium: Secondary | ICD-10-CM | POA: Diagnosis not present

## 2020-07-26 DIAGNOSIS — Z3169 Encounter for other general counseling and advice on procreation: Secondary | ICD-10-CM | POA: Diagnosis not present

## 2020-07-27 DIAGNOSIS — Z6835 Body mass index (BMI) 35.0-35.9, adult: Secondary | ICD-10-CM | POA: Diagnosis not present

## 2020-07-27 DIAGNOSIS — Z01419 Encounter for gynecological examination (general) (routine) without abnormal findings: Secondary | ICD-10-CM | POA: Diagnosis not present

## 2020-07-27 DIAGNOSIS — Z124 Encounter for screening for malignant neoplasm of cervix: Secondary | ICD-10-CM | POA: Diagnosis not present

## 2020-07-27 DIAGNOSIS — Z1231 Encounter for screening mammogram for malignant neoplasm of breast: Secondary | ICD-10-CM | POA: Diagnosis not present

## 2020-08-10 DIAGNOSIS — B351 Tinea unguium: Secondary | ICD-10-CM | POA: Diagnosis not present

## 2020-09-28 DIAGNOSIS — B351 Tinea unguium: Secondary | ICD-10-CM | POA: Diagnosis not present

## 2020-10-11 DIAGNOSIS — Z3141 Encounter for fertility testing: Secondary | ICD-10-CM | POA: Diagnosis not present

## 2020-11-11 DIAGNOSIS — E669 Obesity, unspecified: Secondary | ICD-10-CM | POA: Diagnosis not present

## 2020-11-11 DIAGNOSIS — Z3169 Encounter for other general counseling and advice on procreation: Secondary | ICD-10-CM | POA: Diagnosis not present

## 2020-11-11 DIAGNOSIS — E079 Disorder of thyroid, unspecified: Secondary | ICD-10-CM | POA: Diagnosis not present

## 2020-12-26 DIAGNOSIS — Z3141 Encounter for fertility testing: Secondary | ICD-10-CM | POA: Diagnosis not present

## 2021-01-23 DIAGNOSIS — Z113 Encounter for screening for infections with a predominantly sexual mode of transmission: Secondary | ICD-10-CM | POA: Diagnosis not present

## 2021-01-23 DIAGNOSIS — Z1159 Encounter for screening for other viral diseases: Secondary | ICD-10-CM | POA: Diagnosis not present

## 2021-01-23 DIAGNOSIS — Z0183 Encounter for blood typing: Secondary | ICD-10-CM | POA: Diagnosis not present

## 2021-01-23 DIAGNOSIS — Z31430 Encounter of female for testing for genetic disease carrier status for procreative management: Secondary | ICD-10-CM | POA: Diagnosis not present

## 2021-02-26 DIAGNOSIS — J3489 Other specified disorders of nose and nasal sinuses: Secondary | ICD-10-CM | POA: Diagnosis not present
